# Patient Record
Sex: Female | Born: 1960 | Race: Black or African American | Hispanic: No | Marital: Single | Smoking: Never smoker
Health system: Southern US, Community
[De-identification: ages and names within clinical notes are randomized; demographics above are authoritative.]

## PROBLEM LIST (undated history)

## (undated) DIAGNOSIS — B379 Candidiasis, unspecified: Secondary | ICD-10-CM

## (undated) DIAGNOSIS — I10 Essential (primary) hypertension: Secondary | ICD-10-CM

## (undated) DIAGNOSIS — Z8742 Personal history of other diseases of the female genital tract: Secondary | ICD-10-CM

## (undated) DIAGNOSIS — I8390 Asymptomatic varicose veins of unspecified lower extremity: Secondary | ICD-10-CM

## (undated) DIAGNOSIS — F439 Reaction to severe stress, unspecified: Secondary | ICD-10-CM

## (undated) DIAGNOSIS — R51 Headache: Secondary | ICD-10-CM

## (undated) DIAGNOSIS — Z8619 Personal history of other infectious and parasitic diseases: Secondary | ICD-10-CM

## (undated) HISTORY — DX: Essential (primary) hypertension: I10

## (undated) HISTORY — DX: Reaction to severe stress, unspecified: F43.9

## (undated) HISTORY — DX: Personal history of other diseases of the female genital tract: Z87.42

## (undated) HISTORY — DX: Candidiasis, unspecified: B37.9

## (undated) HISTORY — DX: Headache: R51

## (undated) HISTORY — DX: Asymptomatic varicose veins of unspecified lower extremity: I83.90

## (undated) HISTORY — DX: Personal history of other infectious and parasitic diseases: Z86.19

---

## 1997-12-17 ENCOUNTER — Other Ambulatory Visit: Admission: RE | Admit: 1997-12-17 | Discharge: 1997-12-17 | Payer: Self-pay | Admitting: Obstetrics and Gynecology

## 1999-05-19 ENCOUNTER — Other Ambulatory Visit: Admission: RE | Admit: 1999-05-19 | Discharge: 1999-05-19 | Payer: Self-pay | Admitting: Obstetrics and Gynecology

## 2000-08-30 DIAGNOSIS — B379 Candidiasis, unspecified: Secondary | ICD-10-CM

## 2000-08-30 HISTORY — DX: Candidiasis, unspecified: B37.9

## 2000-11-03 ENCOUNTER — Other Ambulatory Visit: Admission: RE | Admit: 2000-11-03 | Discharge: 2000-11-03 | Payer: Self-pay | Admitting: Obstetrics and Gynecology

## 2000-11-15 ENCOUNTER — Ambulatory Visit (HOSPITAL_COMMUNITY): Admission: RE | Admit: 2000-11-15 | Discharge: 2000-11-15 | Payer: Self-pay | Admitting: Obstetrics and Gynecology

## 2000-11-15 ENCOUNTER — Encounter: Payer: Self-pay | Admitting: Obstetrics and Gynecology

## 2002-01-09 ENCOUNTER — Other Ambulatory Visit: Admission: RE | Admit: 2002-01-09 | Discharge: 2002-01-09 | Payer: Self-pay | Admitting: Obstetrics and Gynecology

## 2003-03-08 ENCOUNTER — Ambulatory Visit (HOSPITAL_COMMUNITY): Admission: RE | Admit: 2003-03-08 | Discharge: 2003-03-08 | Payer: Self-pay | Admitting: Obstetrics and Gynecology

## 2003-03-08 ENCOUNTER — Encounter: Payer: Self-pay | Admitting: Obstetrics and Gynecology

## 2003-06-03 ENCOUNTER — Other Ambulatory Visit: Admission: RE | Admit: 2003-06-03 | Discharge: 2003-06-03 | Payer: Self-pay | Admitting: Obstetrics and Gynecology

## 2004-08-30 DIAGNOSIS — F439 Reaction to severe stress, unspecified: Secondary | ICD-10-CM

## 2004-08-30 DIAGNOSIS — I8390 Asymptomatic varicose veins of unspecified lower extremity: Secondary | ICD-10-CM

## 2004-08-30 DIAGNOSIS — Z8742 Personal history of other diseases of the female genital tract: Secondary | ICD-10-CM

## 2004-08-30 HISTORY — DX: Personal history of other diseases of the female genital tract: Z87.42

## 2004-08-30 HISTORY — DX: Reaction to severe stress, unspecified: F43.9

## 2004-08-30 HISTORY — DX: Asymptomatic varicose veins of unspecified lower extremity: I83.90

## 2004-09-23 ENCOUNTER — Ambulatory Visit (HOSPITAL_COMMUNITY): Admission: RE | Admit: 2004-09-23 | Discharge: 2004-09-23 | Payer: Self-pay | Admitting: Obstetrics and Gynecology

## 2004-11-18 ENCOUNTER — Other Ambulatory Visit: Admission: RE | Admit: 2004-11-18 | Discharge: 2004-11-18 | Payer: Self-pay | Admitting: Obstetrics and Gynecology

## 2005-12-02 ENCOUNTER — Ambulatory Visit (HOSPITAL_COMMUNITY): Admission: RE | Admit: 2005-12-02 | Discharge: 2005-12-02 | Payer: Self-pay | Admitting: Obstetrics and Gynecology

## 2006-02-14 ENCOUNTER — Other Ambulatory Visit: Admission: RE | Admit: 2006-02-14 | Discharge: 2006-02-14 | Payer: Self-pay | Admitting: Obstetrics and Gynecology

## 2007-02-17 ENCOUNTER — Ambulatory Visit (HOSPITAL_COMMUNITY): Admission: RE | Admit: 2007-02-17 | Discharge: 2007-02-17 | Payer: Self-pay | Admitting: Obstetrics and Gynecology

## 2008-03-06 ENCOUNTER — Ambulatory Visit (HOSPITAL_COMMUNITY): Admission: RE | Admit: 2008-03-06 | Discharge: 2008-03-06 | Payer: Self-pay | Admitting: Obstetrics and Gynecology

## 2008-03-13 ENCOUNTER — Encounter: Admission: RE | Admit: 2008-03-13 | Discharge: 2008-03-13 | Payer: Self-pay | Admitting: Obstetrics and Gynecology

## 2009-03-14 ENCOUNTER — Encounter: Admission: RE | Admit: 2009-03-14 | Discharge: 2009-03-14 | Payer: Self-pay | Admitting: Obstetrics and Gynecology

## 2010-03-16 ENCOUNTER — Encounter: Admission: RE | Admit: 2010-03-16 | Discharge: 2010-03-16 | Payer: Self-pay | Admitting: Obstetrics and Gynecology

## 2011-03-23 ENCOUNTER — Other Ambulatory Visit: Payer: Self-pay | Admitting: Obstetrics and Gynecology

## 2011-03-23 DIAGNOSIS — Z1231 Encounter for screening mammogram for malignant neoplasm of breast: Secondary | ICD-10-CM

## 2011-03-29 ENCOUNTER — Ambulatory Visit
Admission: RE | Admit: 2011-03-29 | Discharge: 2011-03-29 | Disposition: A | Discharge status: Home / Self Care | Payer: BC Managed Care – PPO | Source: Ambulatory Visit | Attending: Obstetrics and Gynecology | Admitting: Obstetrics and Gynecology

## 2011-03-29 DIAGNOSIS — Z1231 Encounter for screening mammogram for malignant neoplasm of breast: Secondary | ICD-10-CM

## 2012-04-04 ENCOUNTER — Other Ambulatory Visit: Payer: Self-pay | Admitting: Obstetrics and Gynecology

## 2012-04-04 DIAGNOSIS — Z1231 Encounter for screening mammogram for malignant neoplasm of breast: Secondary | ICD-10-CM

## 2012-04-10 ENCOUNTER — Ambulatory Visit
Admission: RE | Admit: 2012-04-10 | Discharge: 2012-04-10 | Disposition: A | Discharge status: Home / Self Care | Payer: BC Managed Care – PPO | Source: Ambulatory Visit | Attending: Obstetrics and Gynecology | Admitting: Obstetrics and Gynecology

## 2012-04-10 DIAGNOSIS — Z1231 Encounter for screening mammogram for malignant neoplasm of breast: Secondary | ICD-10-CM

## 2012-04-13 ENCOUNTER — Encounter: Payer: Self-pay | Admitting: Obstetrics and Gynecology

## 2012-04-13 ENCOUNTER — Other Ambulatory Visit: Payer: Self-pay | Admitting: Obstetrics and Gynecology

## 2012-04-13 ENCOUNTER — Ambulatory Visit (INDEPENDENT_AMBULATORY_CARE_PROVIDER_SITE_OTHER): Payer: BC Managed Care – PPO | Admitting: Obstetrics and Gynecology

## 2012-04-13 VITALS — BP 112/62 | Ht 66.0 in | Wt 208.0 lb

## 2012-04-13 DIAGNOSIS — R928 Other abnormal and inconclusive findings on diagnostic imaging of breast: Secondary | ICD-10-CM

## 2012-04-13 DIAGNOSIS — I10 Essential (primary) hypertension: Secondary | ICD-10-CM

## 2012-04-13 DIAGNOSIS — D219 Benign neoplasm of connective and other soft tissue, unspecified: Secondary | ICD-10-CM

## 2012-04-13 DIAGNOSIS — R921 Mammographic calcification found on diagnostic imaging of breast: Secondary | ICD-10-CM

## 2012-04-13 DIAGNOSIS — D259 Leiomyoma of uterus, unspecified: Secondary | ICD-10-CM

## 2012-04-13 DIAGNOSIS — Z124 Encounter for screening for malignant neoplasm of cervix: Secondary | ICD-10-CM

## 2012-04-13 DIAGNOSIS — Z8619 Personal history of other infectious and parasitic diseases: Secondary | ICD-10-CM | POA: Insufficient documentation

## 2012-04-13 DIAGNOSIS — F439 Reaction to severe stress, unspecified: Secondary | ICD-10-CM | POA: Insufficient documentation

## 2012-04-13 DIAGNOSIS — I8390 Asymptomatic varicose veins of unspecified lower extremity: Secondary | ICD-10-CM | POA: Insufficient documentation

## 2012-04-13 DIAGNOSIS — R519 Headache, unspecified: Secondary | ICD-10-CM | POA: Insufficient documentation

## 2012-04-13 DIAGNOSIS — F430 Acute stress reaction: Secondary | ICD-10-CM

## 2012-04-13 DIAGNOSIS — Z8742 Personal history of other diseases of the female genital tract: Secondary | ICD-10-CM | POA: Insufficient documentation

## 2012-04-13 DIAGNOSIS — B379 Candidiasis, unspecified: Secondary | ICD-10-CM

## 2012-04-13 DIAGNOSIS — R51 Headache: Secondary | ICD-10-CM

## 2012-04-13 NOTE — Progress Notes (Signed)
AEX  Last Pap: 03/22/2011 WNL: Yes Regular Periods:yes Contraception: None  Monthly Breast exam:no Tetanus<62yrs:yes Nl.Bladder Function:yes Daily BMs:yes Healthy Diet:yes Calcium:no Mammogram:yes Date of Mammogram: 04/12/2012 Exercise:yes Have often Exercise: 2 times weekly Seatbelt: yes Abuse at home: no Stressful work:yes Sigmoid-colonoscopy: none Bone Density: No PCP: Dr. Providence Lanius, [facility] Change in PMH: none Change in [facility]: none Subjective:    Grace Cole is a 51 y.o. female, G0P0000, who presents for an annual exam.     History   Social History  . Marital Status: Single    Spouse Name: N/A    Number of Children: N/A  . Years of Education: N/A   Social History Main Topics  . Smoking status: Never Smoker   . Smokeless tobacco: Never Used  . Alcohol Use: No  . Drug Use: No  . Sexually Active: Yes    Birth Control/ Protection: None   Other Topics Concern  . None   Social History Narrative  . None    Menstrual cycle:   LMP: Patient's last menstrual period was 03/19/2012.           Cycle: REG monthly.  Heavy for 1st 2 days total of 6 days.  No IM bleeding.  Mild cramps, no meds  The following portions of the patient's history were reviewed and updated as appropriate: allergies, current medications, past family history, past medical history, past social history, past surgical history and problem list.  Review of Systems Pertinent items are noted in HPI. Breast:Negative for breast lump,nipple discharge or nipple retraction Gastrointestinal: Negative for abdominal pain, change in bowel habits or rectal bleeding Urinary:negative   Objective:    BP 112/62  Ht 5\' 6"  (1.676 m)  Wt 208 lb (94.348 kg)  BMI 33.57 kg/m2  LMP 03/19/2012    Weight:  Wt Readings from Last 1 Encounters:  04/13/12 208 lb (94.348 kg)          BMI: Body mass index is 33.57 kg/(m^2).  General Appearance: Alert, appropriate appearance for age. No acute distress HEENT:  Grossly normal Neck / Thyroid: Supple, no masses, nodes or enlargement Lungs: clear to auscultation bilaterally Back: No CVA tenderness Breast Exam: No masses or nodes.No dimpling, nipple retraction or discharge. Cardiovascular: Regular rate and rhythm. S1, S2, no murmur Gastrointestinal: Soft, non-tender, no masses or organomegaly Pelvic Exam: External genitalia: normal general appearance Vaginal: normal mucosa without prolapse or lesions Cervix: normal appearance Adnexa: no masses noted Uterus: irregular enlargement and 10-12 weeks size Rectovaginal: normal rectal, no masses Lymphatic Exam: Non-palpable nodes in neck, clavicular, axillary, or inguinal regions Skin: no rash or abnormalities Neurologic: Normal gait and speech, no tremor  Psychiatric: Alert and oriented, appropriate affect.   Wet Prep:not applicable Urinalysis:not applicable UPT: Not done   Assessment:    Fibroids  Menorrhagia, though pt declines rx   Plan:    mammogram pap smear with HR HPV.  IF nl, next due 2016 return annually or prn STD screening: declined Contraception:no method, declines       Corinthian Mizrahi PMD

## 2012-04-14 ENCOUNTER — Ambulatory Visit
Admission: RE | Admit: 2012-04-14 | Discharge: 2012-04-14 | Disposition: A | Discharge status: Home / Self Care | Payer: BC Managed Care – PPO | Source: Ambulatory Visit | Attending: Obstetrics and Gynecology | Admitting: Obstetrics and Gynecology

## 2012-04-14 DIAGNOSIS — R928 Other abnormal and inconclusive findings on diagnostic imaging of breast: Secondary | ICD-10-CM

## 2012-04-17 ENCOUNTER — Encounter: Payer: Self-pay | Admitting: Obstetrics and Gynecology

## 2012-04-17 DIAGNOSIS — R921 Mammographic calcification found on diagnostic imaging of breast: Secondary | ICD-10-CM | POA: Insufficient documentation

## 2012-04-17 LAB — PAP IG AND HPV HIGH-RISK

## 2013-03-20 ENCOUNTER — Other Ambulatory Visit: Payer: Self-pay

## 2013-03-20 DIAGNOSIS — Z1231 Encounter for screening mammogram for malignant neoplasm of breast: Secondary | ICD-10-CM

## 2013-04-16 ENCOUNTER — Ambulatory Visit
Admission: RE | Admit: 2013-04-16 | Discharge: 2013-04-16 | Disposition: A | Discharge status: Home / Self Care | Payer: BC Managed Care – PPO | Source: Ambulatory Visit

## 2013-04-16 DIAGNOSIS — Z1231 Encounter for screening mammogram for malignant neoplasm of breast: Secondary | ICD-10-CM

## 2014-03-25 ENCOUNTER — Other Ambulatory Visit: Payer: Self-pay

## 2014-03-25 DIAGNOSIS — Z1231 Encounter for screening mammogram for malignant neoplasm of breast: Secondary | ICD-10-CM

## 2014-04-17 ENCOUNTER — Ambulatory Visit
Admission: RE | Admit: 2014-04-17 | Discharge: 2014-04-17 | Disposition: A | Discharge status: Home / Self Care | Payer: BC Managed Care – PPO | Source: Ambulatory Visit

## 2014-04-17 ENCOUNTER — Encounter (INDEPENDENT_AMBULATORY_CARE_PROVIDER_SITE_OTHER): Payer: Self-pay

## 2014-04-17 DIAGNOSIS — Z1231 Encounter for screening mammogram for malignant neoplasm of breast: Secondary | ICD-10-CM

## 2015-04-03 ENCOUNTER — Other Ambulatory Visit: Payer: Self-pay

## 2015-04-03 DIAGNOSIS — Z1231 Encounter for screening mammogram for malignant neoplasm of breast: Secondary | ICD-10-CM

## 2015-04-21 ENCOUNTER — Ambulatory Visit
Admission: RE | Admit: 2015-04-21 | Discharge: 2015-04-21 | Disposition: A | Discharge status: Home / Self Care | Payer: BC Managed Care – PPO | Source: Ambulatory Visit

## 2015-04-21 DIAGNOSIS — Z1231 Encounter for screening mammogram for malignant neoplasm of breast: Secondary | ICD-10-CM

## 2016-03-15 ENCOUNTER — Other Ambulatory Visit: Payer: Self-pay | Admitting: Obstetrics and Gynecology

## 2016-03-15 DIAGNOSIS — Z1231 Encounter for screening mammogram for malignant neoplasm of breast: Secondary | ICD-10-CM

## 2016-04-21 ENCOUNTER — Ambulatory Visit: Payer: BC Managed Care – PPO

## 2016-04-27 ENCOUNTER — Ambulatory Visit
Admission: RE | Admit: 2016-04-27 | Discharge: 2016-04-27 | Disposition: A | Discharge status: Home / Self Care | Payer: BC Managed Care – PPO | Source: Ambulatory Visit | Attending: Obstetrics and Gynecology | Admitting: Obstetrics and Gynecology

## 2016-04-27 DIAGNOSIS — Z1231 Encounter for screening mammogram for malignant neoplasm of breast: Secondary | ICD-10-CM

## 2017-03-18 ENCOUNTER — Other Ambulatory Visit: Payer: Self-pay | Admitting: Obstetrics and Gynecology

## 2017-03-18 DIAGNOSIS — Z1231 Encounter for screening mammogram for malignant neoplasm of breast: Secondary | ICD-10-CM

## 2017-05-18 ENCOUNTER — Ambulatory Visit
Admission: RE | Admit: 2017-05-18 | Discharge: 2017-05-18 | Disposition: A | Discharge status: Home / Self Care | Payer: BC Managed Care – PPO | Source: Ambulatory Visit | Attending: Obstetrics and Gynecology | Admitting: Obstetrics and Gynecology

## 2017-05-18 DIAGNOSIS — Z1231 Encounter for screening mammogram for malignant neoplasm of breast: Secondary | ICD-10-CM

## 2018-06-26 ENCOUNTER — Ambulatory Visit
Admission: RE | Admit: 2018-06-26 | Discharge: 2018-06-26 | Disposition: A | Discharge status: Home / Self Care | Payer: BC Managed Care – PPO | Source: Ambulatory Visit | Attending: Obstetrics and Gynecology | Admitting: Obstetrics and Gynecology

## 2018-06-26 ENCOUNTER — Other Ambulatory Visit: Payer: Self-pay | Admitting: Obstetrics and Gynecology

## 2018-06-26 DIAGNOSIS — Z1231 Encounter for screening mammogram for malignant neoplasm of breast: Secondary | ICD-10-CM

## 2019-06-21 ENCOUNTER — Other Ambulatory Visit: Payer: Self-pay | Admitting: Family Medicine

## 2019-06-21 ENCOUNTER — Other Ambulatory Visit: Payer: Self-pay | Admitting: Nurse Practitioner

## 2019-06-21 DIAGNOSIS — Z1231 Encounter for screening mammogram for malignant neoplasm of breast: Secondary | ICD-10-CM

## 2019-08-13 ENCOUNTER — Other Ambulatory Visit: Payer: Self-pay

## 2019-08-13 ENCOUNTER — Ambulatory Visit
Admission: RE | Admit: 2019-08-13 | Discharge: 2019-08-13 | Disposition: A | Discharge status: Home / Self Care | Payer: BC Managed Care – PPO | Source: Ambulatory Visit | Attending: Nurse Practitioner | Admitting: Nurse Practitioner

## 2019-08-13 DIAGNOSIS — Z1231 Encounter for screening mammogram for malignant neoplasm of breast: Secondary | ICD-10-CM

## 2019-10-27 ENCOUNTER — Ambulatory Visit: Payer: BC Managed Care – PPO | Attending: Internal Medicine

## 2019-10-27 DIAGNOSIS — Z23 Encounter for immunization: Secondary | ICD-10-CM

## 2019-10-27 NOTE — Progress Notes (Signed)
   Covid-19 Vaccination Clinic  Name:  Grace Cole    MRN: [id number] DOB: 1960-09-18  10/27/2019  [name] was observed post Covid-19 immunization for 15 minutes without incidence. She was provided with Vaccine Information Sheet and instruction to access the V-Safe system.   [name] was instructed to call 911 with any severe reactions post vaccine: Marland Kitchen Difficulty breathing  . Swelling of your face and throat  . A fast heartbeat  . A bad rash all over your body  . Dizziness and weakness    Immunizations Administered    Name Date Dose VIS Date Route   Pfizer COVID-19 Vaccine 10/27/2019 12:44 PM 0.3 mL 08/10/2019 Intramuscular   Manufacturer: [place]   Lot: [id number]   [place]: [id number]

## 2019-11-17 ENCOUNTER — Ambulatory Visit: Payer: BC Managed Care – PPO | Attending: Internal Medicine

## 2019-11-17 DIAGNOSIS — Z23 Encounter for immunization: Secondary | ICD-10-CM

## 2019-11-17 NOTE — Progress Notes (Signed)
   Covid-19 Vaccination Clinic  Name:  Grace Cole    MRN: [id number] DOB: Jul 23, 1961  11/17/2019  Grace Cole was observed post Covid-19 immunization for 15 minutes without incident. She was provided with Vaccine Information Sheet and instruction to access the V-Safe system.   Grace Cole was instructed to call 911 with any severe reactions post vaccine: Marland Kitchen Difficulty breathing  . Swelling of face and throat  . A fast heartbeat  . A bad rash all over body  . Dizziness and weakness   Immunizations Administered    Name Date Dose VIS Date Route   Pfizer COVID-19 Vaccine 11/17/2019 12:28 PM 0.3 mL 08/10/2019 Intramuscular   Manufacturer: [place]   Lot: [id number]   [place]: [id number]

## 2019-11-27 ENCOUNTER — Ambulatory Visit: Payer: BC Managed Care – PPO

## 2020-07-10 ENCOUNTER — Other Ambulatory Visit: Payer: Self-pay | Admitting: Family Medicine

## 2020-07-10 DIAGNOSIS — Z1231 Encounter for screening mammogram for malignant neoplasm of breast: Secondary | ICD-10-CM

## 2020-11-21 ENCOUNTER — Other Ambulatory Visit: Payer: Self-pay

## 2020-11-21 ENCOUNTER — Ambulatory Visit
Admission: RE | Admit: 2020-11-21 | Discharge: 2020-11-21 | Disposition: A | Discharge status: Home / Self Care | Payer: BC Managed Care – PPO | Source: Ambulatory Visit | Attending: Family Medicine | Admitting: Family Medicine

## 2020-11-21 DIAGNOSIS — Z1231 Encounter for screening mammogram for malignant neoplasm of breast: Secondary | ICD-10-CM

## 2021-03-20 ENCOUNTER — Other Ambulatory Visit (HOSPITAL_COMMUNITY): Payer: Self-pay | Admitting: Gastroenterology

## 2021-03-20 DIAGNOSIS — R1310 Dysphagia, unspecified: Secondary | ICD-10-CM

## 2021-04-17 ENCOUNTER — Ambulatory Visit (HOSPITAL_COMMUNITY)
Admission: RE | Admit: 2021-04-17 | Discharge: 2021-04-17 | Disposition: A | Discharge status: Home / Self Care | Payer: BC Managed Care – PPO | Source: Ambulatory Visit | Attending: Gastroenterology | Admitting: Gastroenterology

## 2021-04-17 ENCOUNTER — Other Ambulatory Visit: Payer: Self-pay

## 2021-04-17 ENCOUNTER — Other Ambulatory Visit (HOSPITAL_COMMUNITY): Payer: Self-pay | Admitting: Gastroenterology

## 2021-04-17 DIAGNOSIS — R1310 Dysphagia, unspecified: Secondary | ICD-10-CM

## 2021-11-26 ENCOUNTER — Other Ambulatory Visit: Payer: Self-pay | Admitting: Family Medicine

## 2021-11-26 DIAGNOSIS — Z1231 Encounter for screening mammogram for malignant neoplasm of breast: Secondary | ICD-10-CM

## 2021-12-03 ENCOUNTER — Ambulatory Visit
Admission: RE | Admit: 2021-12-03 | Discharge: 2021-12-03 | Disposition: A | Discharge status: Home / Self Care | Payer: BC Managed Care – PPO | Source: Ambulatory Visit | Attending: Family Medicine | Admitting: Family Medicine

## 2021-12-03 DIAGNOSIS — Z1231 Encounter for screening mammogram for malignant neoplasm of breast: Secondary | ICD-10-CM

## 2022-11-03 ENCOUNTER — Other Ambulatory Visit: Payer: Self-pay | Admitting: Family Medicine

## 2022-11-03 DIAGNOSIS — Z1231 Encounter for screening mammogram for malignant neoplasm of breast: Secondary | ICD-10-CM

## 2022-12-17 ENCOUNTER — Ambulatory Visit
Admission: RE | Admit: 2022-12-17 | Discharge: 2022-12-17 | Disposition: A | Discharge status: Home / Self Care | Payer: BC Managed Care – PPO | Source: Ambulatory Visit | Attending: Family Medicine | Admitting: Family Medicine

## 2022-12-17 DIAGNOSIS — Z1231 Encounter for screening mammogram for malignant neoplasm of breast: Secondary | ICD-10-CM

## 2022-12-22 ENCOUNTER — Other Ambulatory Visit: Payer: Self-pay | Admitting: Family Medicine

## 2022-12-22 DIAGNOSIS — R928 Other abnormal and inconclusive findings on diagnostic imaging of breast: Secondary | ICD-10-CM

## 2023-01-04 ENCOUNTER — Ambulatory Visit
Admission: RE | Admit: 2023-01-04 | Discharge: 2023-01-04 | Disposition: A | Discharge status: Home / Self Care | Payer: BC Managed Care – PPO | Source: Ambulatory Visit | Attending: Family Medicine | Admitting: Family Medicine

## 2023-01-04 ENCOUNTER — Ambulatory Visit: Admission: RE | Admit: 2023-01-04 | Payer: BC Managed Care – PPO | Source: Ambulatory Visit

## 2023-01-04 DIAGNOSIS — R928 Other abnormal and inconclusive findings on diagnostic imaging of breast: Secondary | ICD-10-CM

## 2023-01-16 IMAGING — MG MM DIGITAL SCREENING BILAT W/ TOMO AND CAD
8 series · 8 of 24 positions shown · non-contrast
Comparison: Previous exam(s).

CLINICAL DATA: Screening.

EXAM:
DIGITAL SCREENING BILATERAL MAMMOGRAM WITH TOMOSYNTHESIS AND CAD
TECHNIQUE: Bilateral screening digital craniocaudal and mediolateral oblique
mammograms were obtained. Bilateral screening digital breast
tomosynthesis was performed. The images were evaluated with
computer-aided detection.

[L CC synth-2D]
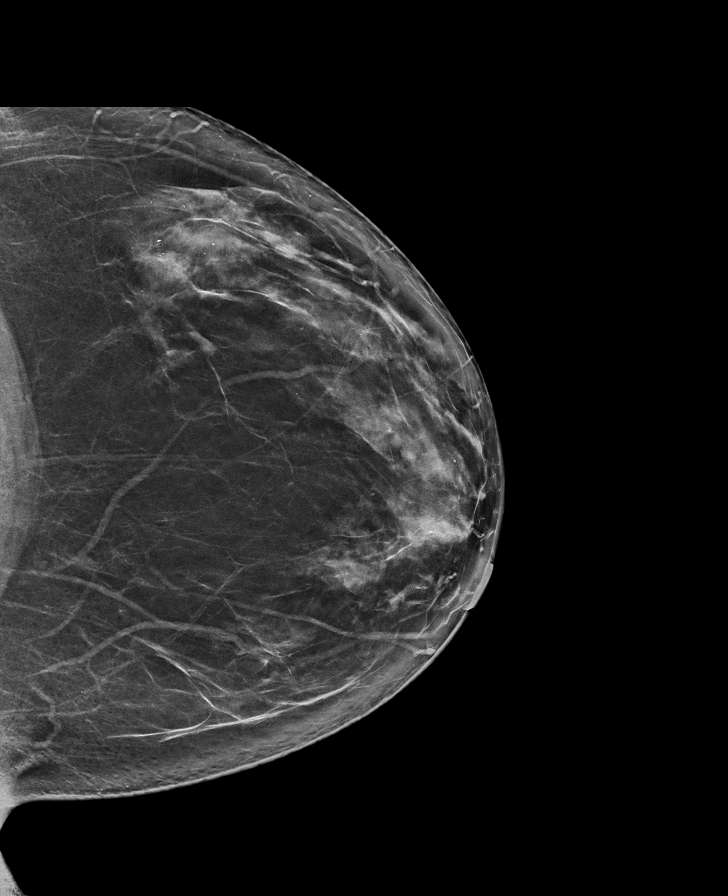

[R CC synth-2D]
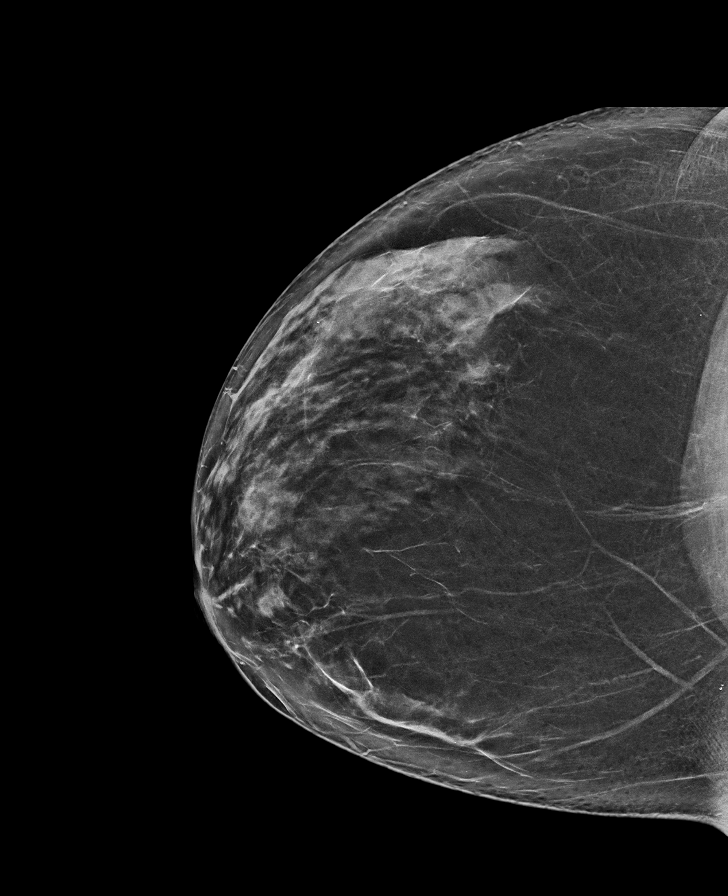

[L MLO synth-2D]
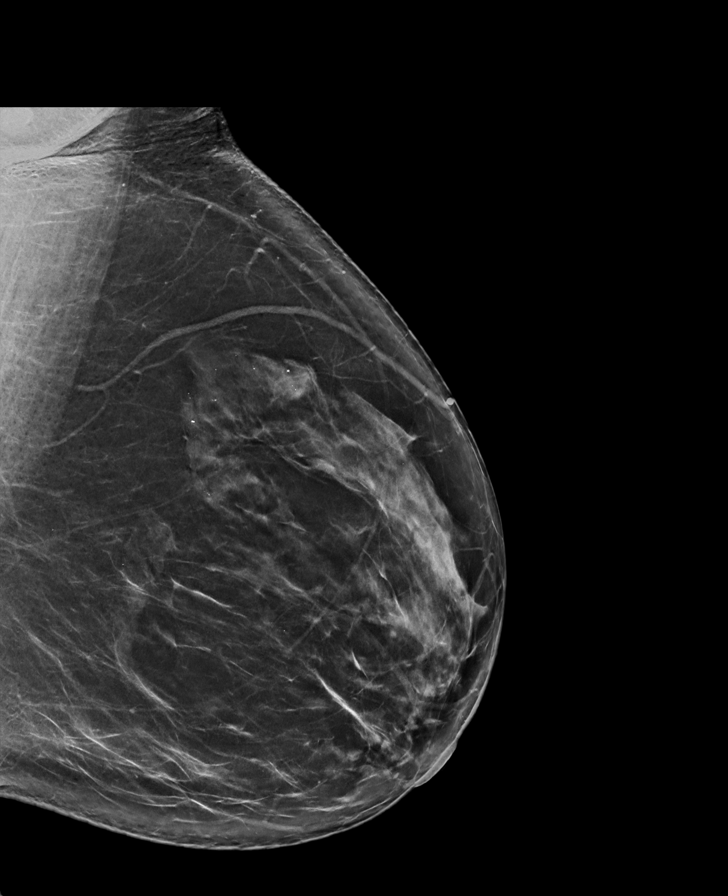

[R MLO synth-2D]
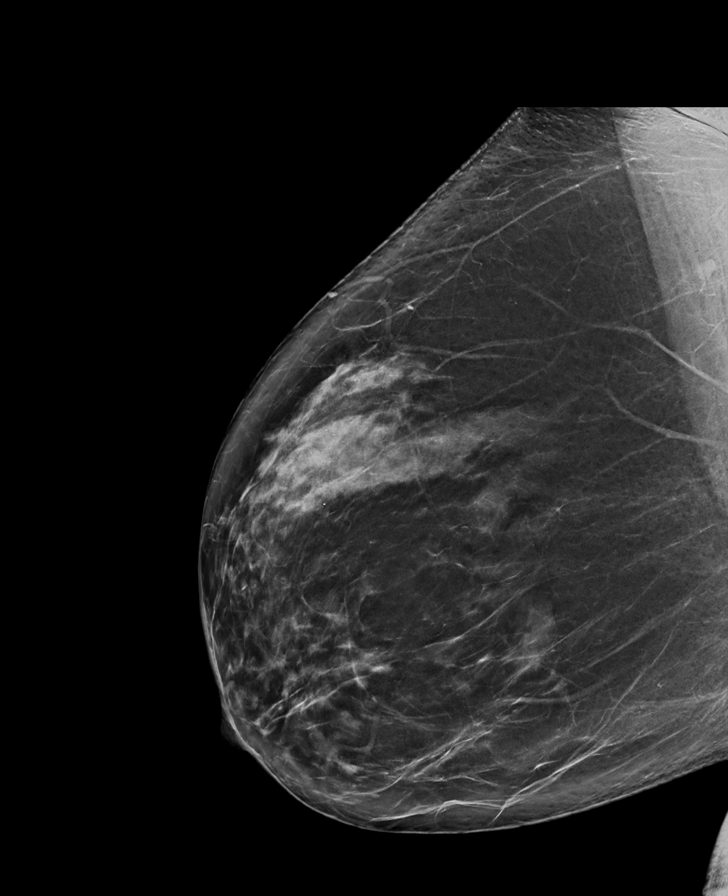

[R MLO tomo · tomo slice 43/85.0]
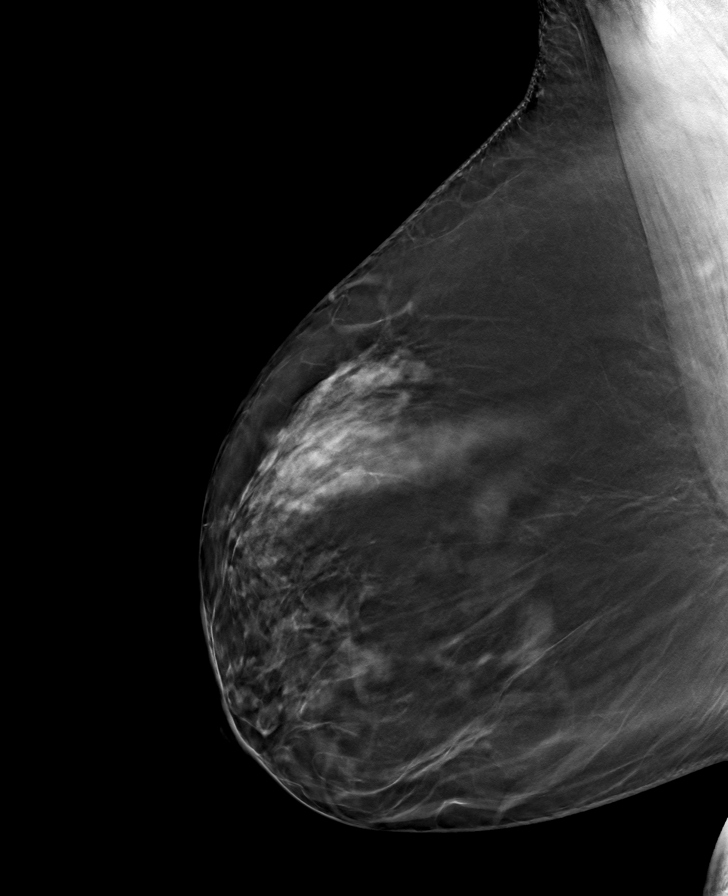

[L CC tomo · tomo slice 43/84.0]
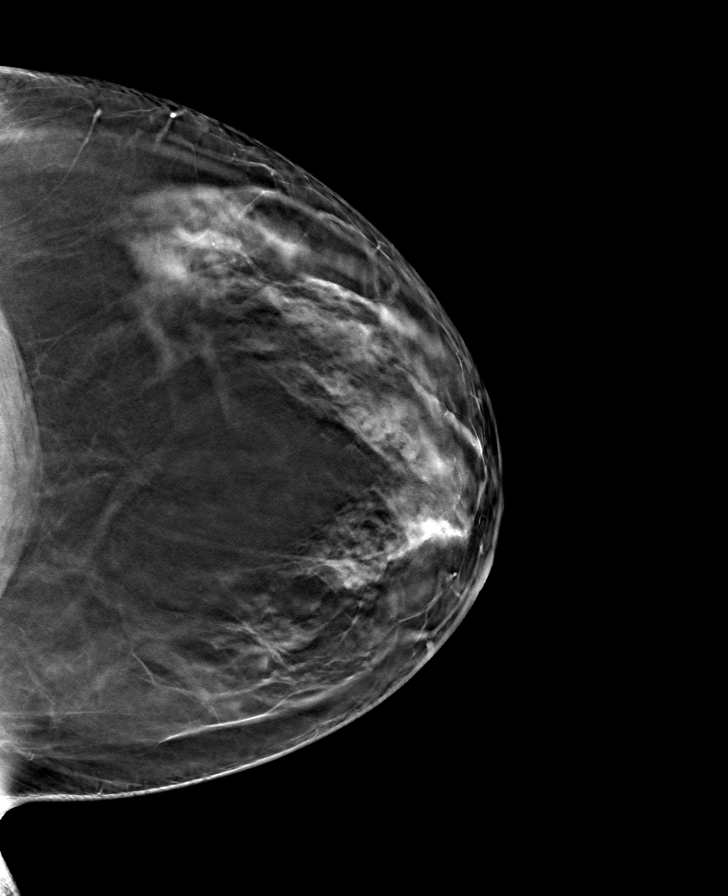

[R CC tomo · tomo slice 43/84.0]
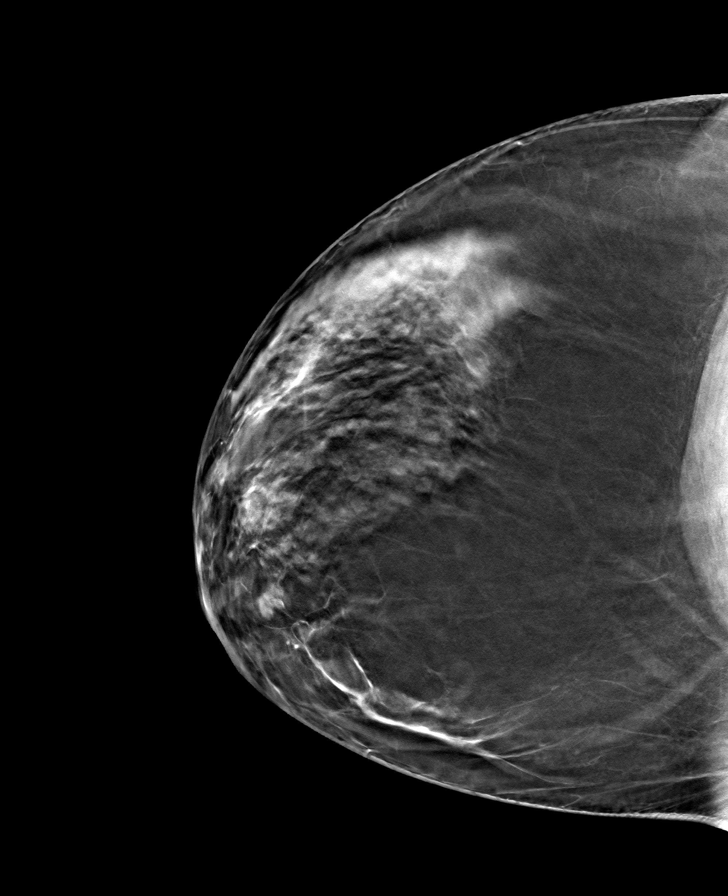

[L MLO tomo · tomo slice 43/85.0]
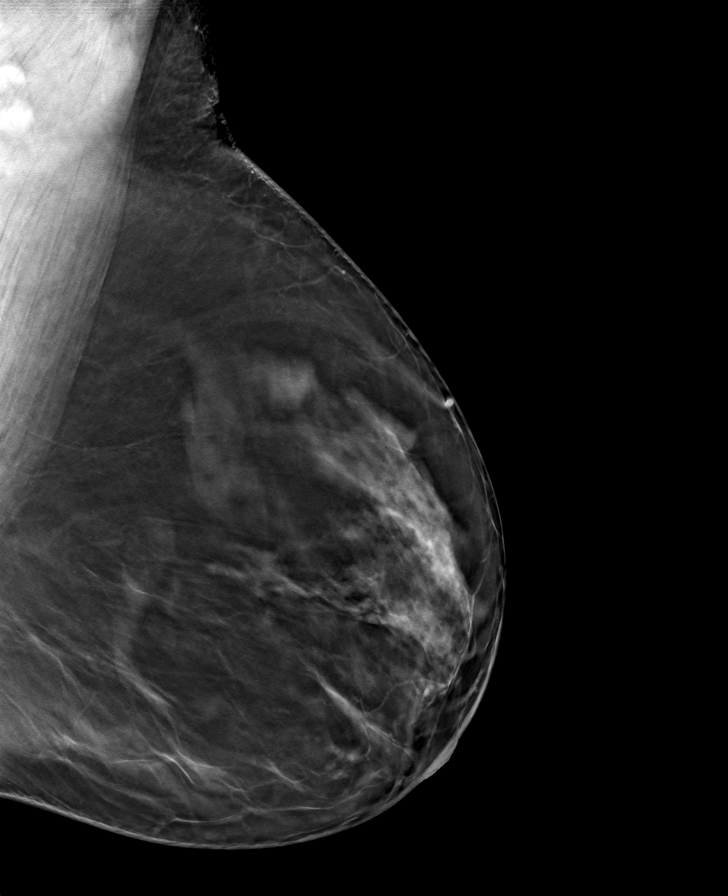

[8 of 24 positions shown; findings below may reference images not displayed]

ACR Breast Density Category c: The breast tissue is heterogeneously
dense, which may obscure small masses.
FINDINGS: There are no findings suspicious for malignancy.
IMPRESSION: No mammographic evidence of malignancy. A result letter of this
screening mammogram will be mailed directly to the patient.

RECOMMENDATION:
Screening mammogram in one year. (Code:Q3-W-BC3)

BI-RADS CATEGORY  1: Negative.

## 2023-10-05 ENCOUNTER — Ambulatory Visit
Admission: EM | Admit: 2023-10-05 | Discharge: 2023-10-05 | Disposition: A | Discharge status: Home / Self Care | Payer: 59

## 2023-10-05 ENCOUNTER — Encounter: Payer: Self-pay | Admitting: Emergency Medicine

## 2023-10-05 DIAGNOSIS — R220 Localized swelling, mass and lump, head: Secondary | ICD-10-CM

## 2023-10-05 DIAGNOSIS — T781XXA Other adverse food reactions, not elsewhere classified, initial encounter: Secondary | ICD-10-CM

## 2023-10-05 MED ORDER — FAMOTIDINE 20 MG PO TABS: 20.0000 mg | ORAL_TABLET | Freq: Two times a day (BID) | ORAL | 0 refills | Status: DC

## 2023-10-05 MED ORDER — PREDNISONE 20 MG PO TABS: 40.0000 mg | ORAL_TABLET | Freq: Every day | ORAL | 0 refills | Status: AC

## 2023-10-05 NOTE — ED Triage Notes (Addendum)
 Pt presents due to tongue swelling. States this is the second time this occurred. Yesterday she had spicy shrimp and afterward her mouth felt dry and tongue swollen.  The first time she experienced this was about two weeks ago after eating jalapeno chips.   Denies any known allergies. States symptoms are worse at night.

## 2023-10-05 NOTE — ED Provider Notes (Signed)
 [facility]    CSN: [id number] Arrival date & time: 10/05/23  1413      History   Chief Complaint Chief Complaint  Patient presents with   Oral Swelling    HPI Grace Cole is a 63 y.o. female.   Grace Cole is a 63 y.o. female presenting for chief complaint of Oral Swelling. She feels as though her tongue is swollen after eating spicy shrimp and catfish last night.  She ate catfish approximately 1 hour prior to going to bed and noticed generalized tongue swelling sensation when she was going to sleep.  Reports throat closure sensation last night and says she looked in the mirror to see if she could see her tonsils and noticed her throat was swollen.  She was able to sleep last night without difficulty and woke up this morning with further coming swelling.  She reports her voice sounds normal and she has never had previous allergy  to shrimp or Fish.  She purchased prebreaded shrimp/catfish from the grocery store and states she normally breads/seasons her for herself at home before cooking. Denies oral itching, rash, fever, chills, nausea, vomiting, diarrhea, abdominal pain.   She additionally denies GERD symptoms. No recent other changes in medications.  No recent steroid or antibiotic use.  Zyrtec and Benadryl make her very sleepy so she has not attempted use of either of these medications at home.  No history of anaphylaxis.     Past Medical History:  Diagnosis Date   H/O dysmenorrhea 2006   H/O: menorrhagia 2006   Headache(784.0)    History of herpes simplex type 2 infection    Hypertension    Monilia infection 2002   Stress 2006   Varicose veins 2006    Patient Active Problem List   Diagnosis Date Noted   Calcification of left breast 04/17/2012   History of herpes simplex type 2 infection    Monilia infection    H/O: menorrhagia    H/O dysmenorrhea    Headache    Stress    Varicose veins    Hypertension     History reviewed. No  pertinent surgical history.  OB History     Gravida  0   Para  0   Term  0   Preterm  0   AB  0   Living  0      SAB  0   IAB  0   Ectopic  0   Multiple  0   Live Births               Home Medications    Prior to Admission medications   Medication Sig Start Date End Date Taking? Authorizing Provider  famotidine  (PEPCID ) 20 MG tablet Take 1 tablet (20 mg total) by mouth 2 (two) times daily for 7 days. 10/05/23 10/12/23 Yes StanhopeDorna HERO, FNP  predniSONE  (DELTASONE ) 20 MG tablet Take 2 tablets (40 mg total) by mouth daily with breakfast for 5 days. 10/05/23 10/10/23 Yes Enedelia Dorna HERO, FNP  amLODipine (NORVASC) 5 MG tablet Take 5 mg by mouth daily.    [provider]  hydrochlorothiazide (HYDRODIURIL) 25 MG tablet Take 25 mg by mouth daily.    [provider]  valACYclovir (VALTREX) 1000 MG tablet Take 1,000 mg by mouth 2 (two) times daily.    [provider]    Family History Family History  Problem Relation Age of Onset   Hypertension Mother    Cancer Father  Cancer Maternal Aunt        breast   Hypertension Maternal Aunt    Diabetes Maternal Aunt    Breast cancer Maternal Aunt    Breast cancer Cousin     Social History Social History   Tobacco Use   Smoking status: Never   Smokeless tobacco: Never  Substance Use Topics   Alcohol use: No   Drug use: No     Allergies   Patient has no known allergies.   Review of Systems Review of Systems Per HPI  Physical Exam Triage Vital Signs ED Triage Vitals  Encounter Vitals Group     BP 10/05/23 1529 (!) 147/87     Systolic BP Percentile --      Diastolic BP Percentile --      Pulse Rate 10/05/23 1529 66     Resp 10/05/23 1529 17     Temp 10/05/23 1529 98 F (36.7 C)     Temp src --      SpO2 10/05/23 1529 95 %     Weight --      Height --      Head Circumference --      Peak Flow --      Pain Score 10/05/23 1534 0     Pain Loc --      Pain  Education --      Exclude from Growth Chart --    No data found.  Updated Vital Signs BP (!) 147/87   Pulse 66   Temp 98 F (36.7 C)   Resp 17   LMP 03/19/2012   SpO2 95%   Visual Acuity Right Eye Distance:   Left Eye Distance:   Bilateral Distance:    Right Eye Near:   Left Eye Near:    Bilateral Near:     Physical Exam Vitals and nursing note reviewed.  Constitutional:      Appearance: She is not ill-appearing or toxic-appearing.  HENT:     Head: Normocephalic and atraumatic.     Right Ear: Hearing, tympanic membrane, ear canal and external ear normal.     Left Ear: Hearing, tympanic membrane, ear canal and external ear normal.     Nose: Nose normal.     Mouth/Throat:     Lips: Pink.     Mouth: Mucous membranes are moist. No injury or oral lesions.     Dentition: Normal dentition.     Tongue: No lesions. Tongue does not deviate from midline.     Pharynx: Oropharynx is clear. Uvula midline. No pharyngeal swelling, oropharyngeal exudate, posterior oropharyngeal erythema, uvula swelling or postnasal drip.     Tonsils: No tonsillar exudate.     Comments: Tongue appears mildly swollen, no perioral rash or perioral swelling.  Speaking in full sentences without difficulty.  Phonation is normal.  No throat swelling or trismus. Eyes:     General: Lids are normal. Vision grossly intact. Gaze aligned appropriately.     Extraocular Movements: Extraocular movements intact.     Conjunctiva/sclera: Conjunctivae normal.  Neck:     Trachea: Trachea and phonation normal.  Cardiovascular:     Rate and Rhythm: Normal rate and regular rhythm.     Heart sounds: Normal heart sounds, S1 normal and S2 normal.  Pulmonary:     Effort: Pulmonary effort is normal. No respiratory distress.     Breath sounds: Normal breath sounds and air entry.  Musculoskeletal:     Cervical back: Neck supple.  Lymphadenopathy:  Cervical: No cervical adenopathy.  Skin:    General: Skin is warm and dry.      Capillary Refill: Capillary refill takes less than 2 seconds.     Findings: No rash.  Neurological:     General: No focal deficit present.     Mental Status: She is alert and oriented to person, place, and time. Mental status is at baseline.     Cranial Nerves: No dysarthria or facial asymmetry.  Psychiatric:        Mood and Affect: Mood normal.        Speech: Speech normal.        Behavior: Behavior normal.        Thought Content: Thought content normal.        Judgment: Judgment normal.      UC Treatments / Results  Labs (all labs ordered are listed, but only abnormal results are displayed) Labs Reviewed - No data to display  EKG   Radiology No results found.  Procedures Procedures (including critical care time)  Medications Ordered in UC Medications - No data to display  Initial Impression / Assessment and Plan / UC Course  I have reviewed the triage vital signs and the nursing notes.  Pertinent labs & imaging results that were available during my care of the patient were reviewed by me and considered in my medical decision making (see chart for details).   1.  Allergic reaction to food, mild tongue swelling Presentation suspicious for allergic reaction to unknown food substance. Pepcid  and prednisone  burst ordered. Recommended Zyrtec, however patient would prefer not to take this given it causes significant drowsiness. HEENT exam is stable and airway is intact. Lungs clear. No systemic signs of allergic reaction. Recommend follow-up with allergy  and asthma specialist as well as PCP.  Counseled patient on potential for adverse effects with medications prescribed/recommended today, strict ER and return-to-clinic precautions discussed, patient verbalized understanding.    Final Clinical Impressions(s) / UC Diagnoses   Final diagnoses:  Allergic reaction to food, initial encounter  Mild tongue swelling     Discharge Instructions      You have been  evaluated today for an allergic reaction. We gave you medicine to help with symptoms.   Take medications sent to pharmacy as directed.   Please schedule an appointment with your primary care provider for follow-up and ongoing management. Return if you experience rashes, difficulty breathing or swallowing, lip/mouth/tongue swelling, vomiting, or for any other concerning symptoms. If symptoms are severe, please go to the ER for further workup.     ED Prescriptions     Medication Sig Dispense Auth. Provider   predniSONE  (DELTASONE ) 20 MG tablet Take 2 tablets (40 mg total) by mouth daily with breakfast for 5 days. 10 tablet Enedelia Dorna HERO, FNP   famotidine  (PEPCID ) 20 MG tablet Take 1 tablet (20 mg total) by mouth 2 (two) times daily for 7 days. 14 tablet Enedelia Dorna HERO, FNP      PDMP not reviewed this encounter.   Enedelia Dorna HERO, [place] 10/05/23 [phone]

## 2023-10-05 NOTE — Discharge Instructions (Addendum)
 You have been evaluated today for an allergic reaction. We gave you medicine to help with symptoms.   Take medications sent to pharmacy as directed.   Please schedule an appointment with your primary care provider for follow-up and ongoing management. Return if you experience rashes, difficulty breathing or swallowing, lip/mouth/tongue swelling, vomiting, or for any other concerning symptoms. If symptoms are severe, please go to the ER for further workup.

## 2023-11-10 ENCOUNTER — Ambulatory Visit: Payer: Self-pay | Admitting: Internal Medicine

## 2023-11-10 ENCOUNTER — Encounter: Payer: Self-pay | Admitting: Internal Medicine

## 2023-11-10 ENCOUNTER — Other Ambulatory Visit: Payer: Self-pay

## 2023-11-10 VITALS — BP 122/84 | HR 76 | Temp 97.7°F | Resp 17 | Ht 65.8 in | Wt 222.0 lb

## 2023-11-10 DIAGNOSIS — T783XXA Angioneurotic edema, initial encounter: Secondary | ICD-10-CM

## 2023-11-10 DIAGNOSIS — J387 Other diseases of larynx: Secondary | ICD-10-CM

## 2023-11-10 DIAGNOSIS — R058 Other specified cough: Secondary | ICD-10-CM

## 2023-11-10 DIAGNOSIS — T783XXD Angioneurotic edema, subsequent encounter: Secondary | ICD-10-CM | POA: Diagnosis not present

## 2023-11-10 DIAGNOSIS — J310 Chronic rhinitis: Secondary | ICD-10-CM | POA: Diagnosis not present

## 2023-11-10 DIAGNOSIS — K219 Gastro-esophageal reflux disease without esophagitis: Secondary | ICD-10-CM | POA: Diagnosis not present

## 2023-11-10 MED ORDER — AZELASTINE HCL 0.1 % NA SOLN: 2.0000 | Freq: Two times a day (BID) | NASAL | 12 refills | Status: DC

## 2023-11-10 MED ORDER — AZELASTINE HCL 0.1 % NA SOLN: 2.0000 | Freq: Two times a day (BID) | NASAL | 12 refills | Status: AC

## 2023-11-10 MED ORDER — FLUTICASONE PROPIONATE 50 MCG/ACT NA SUSP: 1.0000 | Freq: Every day | NASAL | 2 refills | Status: DC

## 2023-11-10 MED ORDER — FLUTICASONE PROPIONATE 50 MCG/ACT NA SUSP: 1.0000 | Freq: Every day | NASAL | 2 refills | Status: AC

## 2023-11-10 NOTE — Progress Notes (Signed)
 NEW PATIENT Date of Service/Encounter:  11/10/23 Referring provider: Stevphen Rochester, MD Primary care provider: Stevphen Rochester, MD  Subjective:  Grace Cole is a 62 y.o. female  presenting today for evaluation of tongue swelling, rhinitis, cough  History obtained from: chart review and patient.   Discussed the use of AI scribe software for clinical note transcription with the patient, who gave verbal consent to proceed.  History of Present Illness   The patient presents with tongue swelling.  She has experienced two episodes of tongue swelling, the first in mid-January and the second in mid-February. The initial episode began a few hours after consuming spicy jalapeno-flavored potato chips, leading to difficulty swallowing and noticeable tongue swelling the next morning. The swelling lasted 12 to 18 hours and subsided after she took ginger drops and ginger tea. The second episode occurred after eating a spicier brand of fried catfish and shrimp, with similar symptoms. She received prednisone and Pepcid at urgent care, resulting in quicker resolution. No further episodes have occurred since then.  In addition to tongue swelling, she experiences a persistent sensation of her throat feeling sticky and as if it is closing, particularly at night. This has been ongoing for the past week, often accompanied by congestion and mucus production, especially in the morning. She has not been taking any medication regularly for these symptoms, although she has used nasal sprays in the past for about two weeks at a time.  She has experienced epistaxis since the winter, which she attributes to the dry air.         Other allergy screening: Asthma: no Rhino conjunctivitis: yes Food allergy: no Medication allergy: no Hymenoptera allergy: no Urticaria:  angioedema  Eczema:no History of recurrent infections suggestive of immunodeficency: no Vaccinations are up to date.   Past  Medical History: Past Medical History:  Diagnosis Date   H/O dysmenorrhea 2006   H/O: menorrhagia 2006   Headache(784.0)    History of herpes simplex type 2 infection    Hypertension    Monilia infection 2002   Stress 2006   Varicose veins 2006   Medication List:  Current Outpatient Medications  Medication Sig Dispense Refill   azelastine (ASTELIN) 0.1 % nasal spray Place 2 sprays into both nostrils 2 (two) times daily. Use in each nostril as directed 30 mL 12   fluticasone (FLONASE) 50 MCG/ACT nasal spray Place 1 spray into both nostrils daily. 16 g 2   amLODipine (NORVASC) 5 MG tablet Take 5 mg by mouth daily.     famotidine (PEPCID) 20 MG tablet Take 1 tablet (20 mg total) by mouth 2 (two) times daily for 7 days. 14 tablet 0   hydrochlorothiazide (HYDRODIURIL) 25 MG tablet Take 25 mg by mouth daily.     valACYclovir (VALTREX) 1000 MG tablet Take 1,000 mg by mouth 2 (two) times daily.     No current facility-administered medications for this visit.   Known Allergies:  No Known Allergies Past Surgical History: No past surgical history on file. Family History: Family History  Problem Relation Age of Onset   Hypertension Mother    Cancer Father    Cancer Maternal Aunt        breast   Hypertension Maternal Aunt    Diabetes Maternal Aunt    Breast cancer Maternal Aunt    Breast cancer Cousin      ROS:  All other systems negative except as noted per HPI.  Objective:  Blood pressure 122/84, pulse  76, temperature 97.7 F (36.5 C), temperature source Temporal, resp. rate 17, height 5' 5.8" (1.671 m), weight 222 lb (100.7 kg), last menstrual period 03/19/2012, SpO2 98%. Body mass index is 36.05 kg/m. Physical Exam:  General Appearance:  Alert, cooperative, no distress, appears stated age  Head:  Normocephalic, without obvious abnormality, atraumatic  Eyes:  Conjunctiva clear, EOM's intact  Ears EACs normal bilaterally  Nose: Nares normal, normal mucosa, no visible  anterior polyps, and septum midline  Throat: Lips, tongue normal; teeth and gums normal, + cobblestoning  Neck: Supple, symmetrical  Lungs:   clear to auscultation bilaterally, Respirations unlabored, no coughing  Heart:  regular rate and rhythm and no murmur, Appears well perfused  Extremities: No edema  Skin: Skin color, texture, turgor normal and no rashes or lesions on visualized portions of skin  Neurologic: No gross deficits   Diagnostics: None    Labs:  Lab Orders         Complement component c1q         C4 complement         C1 esterase inhibitor, functional         C1 Esterase Inhibitor         Tryptase       Assessment and Plan  Assessment and Plan    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 Bradykinin and histamine pathways considered. - Order labs for bradykinin and histamine pathways.  -c1q, c1 esterase level and function, C4, tryptase  - Schedule allergy testing for food and spice allergies. - Advise avoidance of spicy foods, shrimp, and jalapeno chips.  Postnasal drip Postnasal drip likely due to sinus drainage causing throat sticking sensation and congestion. - Start Flonase, one spray per nostril at night. - Start Astelin, one spray per nostril twice daily after allergy testing. - Schedule allergy testing for environmental allergens. - Will start daily antihistamine after allergy testing   Vocal cord dysfunction (VCD) Suspected VCD with throat closing sensation and hoarseness, possibly triggered by strong odors. - Provide vocal exercises for VCD management. - Consider referral to speech therapy if symptoms persist.  Reflux-induced respiratory symptoms Symptoms suggestive of reflux-induced respiratory disease, exacerbated by lying flat. - Elevate head of bed. - Restart Pepcid after allergy testing.  Epistaxis Epistaxis likely due to dry air, especially in winter. -Nasal saline spray and/or nasal saline  gel is recommended to moisturize nasal mucosa. -The use of a cool-mist humidifier during the night is recommended. -During epistaxis, if needed, oxymetazoline (Afrin) nasal spray may be applied to a cotton ball to help stanch the blood flow. -If this problem persists or progresses, otolaryngology evaluation may be warranted.    Follow up: for allergy testing (1-68, other select foods)       This note in its entirety was forwarded to the Provider who requested this consultation.  Other:  none   Thank you for your kind referral. I appreciate the opportunity to take part in Early's care. Please do not hesitate to contact me with questions.  Sincerely,  Thank you so much for letting me partake in your care today.  Don't hesitate to reach out if you have any additional concerns!  Ferol Luz, MD  [facility] [place], [place]

## 2023-11-10 NOTE — Patient Instructions (Addendum)
 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 Bradykinin and histamine pathways considered. - Order labs for bradykinin and histamine pathways.  -c1q, c1 esterase level and function, C4, tryptase  - Schedule allergy testing for food and spice allergies. - Advise avoidance of spicy foods, shrimp, and jalapeno chips.  Postnasal drip Postnasal drip likely due to sinus drainage causing throat sticking sensation and congestion. - Start Flonase, one spray per nostril at night. - Start Astelin, one spray per nostril twice daily after allergy testing. - Schedule allergy testing for environmental allergens. - Will start daily antihistamine after allergy testing   Vocal cord dysfunction (VCD) Suspected VCD with throat closing sensation and hoarseness, possibly triggered by strong odors. - Provide vocal exercises for VCD management. - Consider referral to speech therapy if symptoms persist.  Reflux-induced respiratory symptoms Symptoms suggestive of reflux-induced respiratory disease, exacerbated by lying flat. - Elevate head of bed. - Restart Pepcid after allergy testing.  Epistaxis Epistaxis likely due to dry air, especially in winter. -Nasal saline spray and/or nasal saline gel is recommended to moisturize nasal mucosa. -The use of a cool-mist humidifier during the night is recommended. -During epistaxis, if needed, oxymetazoline (Afrin) nasal spray may be applied to a cotton ball to help stanch the blood flow. -If this problem persists or progresses, otolaryngology evaluation may be warranted.      Follow up: for allergy testing (1-68, other select foods)   Thank you so much for letting me partake in your care today.  Don't hesitate to reach out if you have any additional concerns!  Grace Luz, MD  [facility] [place], [place]  Vocal Cord Dysfunction (VCD) /Inspiratory Laryngeal Obstruction (ILO)/ Exercise- Induced  Laryngeal Obstruction Exercises (EILO)  Practice these techniques several times a day, so that when you need them, they will be automatic, and will work right away (or very fast) to open up the spasming (closed) vocal cords.  PREPARATION: ? Loosen clothing at waist, so nothing is tight or snug. Open top buttons of pants or skirt, unzip pant or skirt zippers, pull shirt out over pants or skirt. This prevents pressure on the stomach, which can cause stomach reflux (backup of corrosive liquid) into the esophagus. Gastric reflux is a frequent cause of VCD attacks. ? Sit in a comfortable chair. When you need to use these techniques, you may be standing, lying, or sitting-BUT first try to learn them while sitting because they are easier to learn in this position. ? Keep water handy. Take sips, so your mouth and throat will not dry out. Swallow carefully, to avoid choking. ? Put your right (or left) thumb on your belly button, with the rest of your fingers below the thumb, so you are GENTLY touching your belly (lower abdomen). ? Doing this will focus your attention on your lower abdominal muscles. ? Relax your chest. Relax your shoulders. Relax your neck. Relax your throat. This helps you to try to use ONLY your belly (lower abdomen) muscles. Consciously try NOT to use chest muscles, etc. Chest muscle use seems to irritate vocal cords while "belly" muscles tend to relax them.  BREATHE OUT (EXHALE) FIRST WITH slightly PURSED LIPS: ? Since most people cannot inhale, (breathe in), during VCD attacks, please start by breathing out (exhaling) in the special way described below, and this will usually open up the vocal cords, immediately. ? Start, by breathing out (exhaling) with lips "pursed" as if you are trying to gently blow out  a candle. ? This is similar to whistling, but your lips will not be as "puckered' as when whistling and your lips will not be pushed forward, like when whistling. If you look  in a mirror, you would see a mostly horizontal line of space between the lips, while you exhale the 'ffffffffffffffffffff'. ? This may be silent, or may sound like a gentle wind or breeze, like 'fffffffffffffff' and your lips should be symmetrical, around your teeth. You lower lip will not be touching your upper teeth, like when someone says the name [id number]. This is one continuous flow of exhaled air, either silent, or making a slightly windy sound. ? Feel your hand on your belly (lower abdomen) come IN, a little bit toward your back, as you are 'working' only your lower belly muscles. ? This abdominal exhaled 'fffffffffffffffff' alone often stops VCD attacks very quickly.  ? Some prefer to try a variation of exhaling 'ffffffffffff'. Try exhaling quickly, 'ffff', 'fffff', 'ffff'--all part of one exhalation. Do not inhale in between quick "fff's. This helps some people more quickly open up the spasming vocal cords. This is like blowing out a slightly stubborn candle, exhaling against a tiny bit of resistance (like trying to blow out a trick birthday candle). ? If any of this helps, try to slow down the exhalations, and gently exhale 'ffffffffffffffffff'. ? Some people prefer to exhale gently 'ssssssssssssssss' or 'shhhhhhhhhhhhh' rather than 'fffffffffffffff', etc. Choose what works best in your case, or what is most comfortable for you. ? You may need to repeat exhaling 'ffffffffffffff' (or 'ffff', 'ffff', 'ffff'), etc, several times to relax the spasming vocal cords. Repeating this often stops a VCD attack so that you can inhale (breathe in) again.

## 2023-11-15 ENCOUNTER — Ambulatory Visit: Admitting: Internal Medicine

## 2023-11-15 DIAGNOSIS — T783XXD Angioneurotic edema, subsequent encounter: Secondary | ICD-10-CM | POA: Diagnosis not present

## 2023-11-15 DIAGNOSIS — J3089 Other allergic rhinitis: Secondary | ICD-10-CM | POA: Diagnosis not present

## 2023-11-15 DIAGNOSIS — K219 Gastro-esophageal reflux disease without esophagitis: Secondary | ICD-10-CM

## 2023-11-15 DIAGNOSIS — T783XXA Angioneurotic edema, initial encounter: Secondary | ICD-10-CM

## 2023-11-15 DIAGNOSIS — J387 Other diseases of larynx: Secondary | ICD-10-CM

## 2023-11-15 MED ORDER — FAMOTIDINE 20 MG PO TABS: 20.0000 mg | ORAL_TABLET | Freq: Two times a day (BID) | ORAL | 5 refills | Status: AC

## 2023-11-15 MED ORDER — EPINEPHRINE 0.3 MG/0.3ML IJ SOAJ: 0.3000 mg | INTRAMUSCULAR | 1 refills | Status: AC | PRN

## 2023-11-15 MED ORDER — LEVOCETIRIZINE DIHYDROCHLORIDE 5 MG PO TABS: 5.0000 mg | ORAL_TABLET | Freq: Every evening | ORAL | 5 refills | Status: AC

## 2023-11-15 NOTE — Patient Instructions (Signed)
 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 Bradykinin and histamine pathways considered. - Allergy test; positive to oyster and scallops  - Strict avoidance of shellfish ( shirmp ,crab, lobster, crawfish) and scallops, oysters, clams   Postnasal drip Postnasal drip likely due to sinus drainage causing throat sticking sensation and congestion. Allergy test: positive to grass, weed, tree, dust mite  - Start avoidance measures  - Continue Flonase, one spray per nostril at night. - Continue Astelin, one spray per nostril twice daily after allergy testing. - Start daily antihistamine: options include cetirizine, fexofenadine, loratadine, levocetirizine   Vocal cord dysfunction (VCD) Suspected VCD with throat closing sensation and hoarseness, possibly triggered by strong odors. - Continue vocal exercises for VCD management. - Consider referral to speech therapy if symptoms persist.  Reflux-induced respiratory symptoms Symptoms suggestive of reflux-induced respiratory disease, exacerbated by lying flat. - Elevate head of bed. - Start Pepcid 20mg  daily   Epistaxis Epistaxis likely due to dry air, especially in winter. -Nasal saline spray and/or nasal saline gel is recommended to moisturize nasal mucosa. -The use of a cool-mist humidifier during the night is recommended. -During epistaxis, if needed, oxymetazoline (Afrin) nasal spray may be applied to a cotton ball to help stanch the blood flow. -If this problem persists or progresses, otolaryngology evaluation may be warranted.    Follow up: 3 months   Thank you so much for letting me partake in your care today.  Don't hesitate to reach out if you have any additional concerns!  Ferol Luz, MD  [facility] [place], [place]  Reducing Pollen Exposure  The American Academy of Allergy, Asthma and Immunology suggests the following steps to reduce your exposure to pollen during  allergy seasons.    Do not hang sheets or clothing out to dry; pollen may collect on these items. Do not mow lawns or spend time around freshly cut grass; mowing stirs up pollen. Keep windows closed at night.  Keep car windows closed while driving. Minimize morning activities outdoors, a time when pollen counts are usually at their highest. Stay indoors as much as possible when pollen counts or humidity is high and on windy days when pollen tends to remain in the air longer. Use air conditioning when possible.  Many air conditioners have filters that trap the pollen spores. Use a HEPA room air filter to remove pollen form the indoor air you breathe.  DUST MITE AVOIDANCE MEASURES:  There are three main measures that need and can be taken to avoid house dust mites:  Reduce accumulation of dust in general -reduce furniture, clothing, carpeting, books, stuffed animals, especially in bedroom  Separate yourself from the dust -use pillow and mattress encasements (can be found at stores such as [employer] or online) -avoid direct exposure to air condition flow -use a HEPA filter device, especially in the bedroom; you can also use a HEPA filter vacuum cleaner -wipe dust with a moist towel instead of a dry towel or broom when cleaning  Decrease mites and/or their secretions -wash clothing and linen and stuffed animals at highest temperature possible, at least every 2 weeks -stuffed animals can also be placed in a bag and put in a freezer overnight  Despite the above measures, it is impossible to eliminate dust mites or their allergen completely from your home.  With the above measures the burden of mites in your home can be diminished, with the goal of minimizing your allergic symptoms.  Success will be reached only when implementing and using all means together.

## 2023-11-15 NOTE — Progress Notes (Signed)
 Date of Service/Encounter:  11/15/23  Allergy testing appointment   Initial visit on 11/10/23, seen for angioedema .  Please see that note for additional details.  Today reports for allergy diagnostic testing:    DIAGNOSTICS:  Skin Testing: Environmental allergy panel and select foods. Adequate positive and negative controls Results discussed with patient/family.  Airborne Adult Perc - 11/15/23 1130     Time Antigen Placed 1130    Allergen Manufacturer Waynette Buttery    Location Back    Number of Test 55    1. Control-Buffer 50% Glycerol Negative    2. Control-Histamine 4+    3. Bahia Negative    4. [place] 3+    5. Johnson 2+    6. Kentucky Blue Negative    7. Meadow Fescue Negative    8. Perennial Rye 3+    9. Timothy 2+    10. Ragweed Mix Negative    11. Cocklebur Negative    12. Plantain,  English 2+    13. Baccharis Negative    14. Dog Fennel Negative    15. Russian Thistle 3+    16. Lamb's Quarters 3+    17. Sheep Sorrell 3+    18. Rough Pigweed 3+    19. Marsh Elder, Rough Negative    20. Mugwort, Common Negative    21. Box, Elder 2+    22. Cedar, red 4+    23. Sweet Gum 2+    24. Pecan Pollen 2+    25. Pine Mix Negative    26. Walnut, Black Pollen 2+    27. Red Mulberry 3+    28. Ash Mix 3+    29. Birch Mix 3+    30. Beech American 3+    31. Cottonwood, Eastern 3+    32. Hickory, White 3+    33. Maple Mix Negative    34. Oak, [place] Mix 3+    35. Sycamore Eastern 3+    36. Alternaria Alternata Negative    37. Cladosporium Herbarum Negative    38. Aspergillus Mix Negative    39. Penicillium Mix Negative    40. Bipolaris Sorokiniana (Helminthosporium) Negative    41. Drechslera Spicifera (Curvularia) Negative    42. Mucor Plumbeus Negative    43. Fusarium Moniliforme Negative    44. Aureobasidium Pullulans (pullulara) Negative    45. Rhizopus Oryzae Negative    46. Botrytis Cinera Negative    47. Epicoccum Nigrum Negative    48. Phoma Betae Negative     49. Dust Mite Mix Negative    50. Cat Hair 10,000 BAU/ml Negative    51.  Dog Epithelia Negative    52. Mixed Feathers Negative    53. Horse Epithelia Negative    54. Cockroach, German Negative    55. Tobacco Leaf Negative             Intradermal - 11/15/23 1151     Time Antigen Placed 1152    Location Arm    Number of Test 11    Control Negative    Bahia 2+    Ragweed Mix 3+    Mold 1 Negative    Mold 2 Negative    Mold 3 Negative    Mold 4 Negative    Mite Mix Negative    Cat Negative    Dog Negative    Cockroach Negative             Food Adult Perc - 11/15/23 1100     Time  Antigen Placed 1124    Allergen Manufacturer Greer    Location Back    Number of allergen test 23    1. Peanut Negative    8. Shellfish Mix Negative    9. Fish Mix Negative    10. Cashew Negative    11. Walnut Food Negative    12. Almond Negative    13. Hazelnut Negative    14. Pecan Food Negative    15. Pistachio Negative    16. [place] Nut Negative    17. Coconut Negative    23. Shrimp Negative    24. Crab Negative    25. Lobster Negative    26. Oyster 2+    27. Scallops 2+    38. Tomato Negative    39. White Potato Negative    40. Sweet Potato Negative    45. Green Pepper Negative    47. Onion Negative    69. Ginger Negative    70. Garlic Negative    71. Pepper, Black Negative             Allergy testing results were read and interpreted by myself, documented by clinical staff.  Patient provided with copy of allergy testing along with avoidance measures when indicated.   Ferol Luz, MD  [facility]

## 2023-11-15 NOTE — Addendum Note (Signed)
 Addended by: Modesto Charon on: 11/15/2023 01:49 PM   Modules accepted: Orders

## 2023-11-18 ENCOUNTER — Other Ambulatory Visit: Payer: Self-pay

## 2023-11-18 ENCOUNTER — Telehealth: Payer: Self-pay

## 2023-11-18 NOTE — Telephone Encounter (Signed)
Pt called requesting lab result

## 2023-11-19 LAB — C1 ESTERASE INHIBITOR, FUNCTIONAL: C1INH Functional/C1INH Total MFr SerPl: >110 %{normal}

## 2023-11-19 LAB — C1 ESTERASE INHIBITOR: C1INH SerPl-mCnc: 40 mg/dL — ABNORMAL HIGH (ref 21–39)

## 2023-11-19 LAB — TRYPTASE: Tryptase: 10.4 ug/L (ref 2.2–13.2)

## 2023-11-19 LAB — COMPLEMENT COMPONENT C1Q: Complement C1Q: 15.2 mg/dL (ref 10.3–20.5)

## 2023-11-19 LAB — C4 COMPLEMENT: Complement C4, Serum: 55 mg/dL — ABNORMAL HIGH (ref 12–38)

## 2023-11-21 NOTE — Telephone Encounter (Signed)
 Swelling labs were all normal except for her tryptase (marker of mast cell activation) which was slightly elevated at 10.4.  This tells [place] the swelling is coming from histamine and the treatment plan is high dose antihistamines to calm down the skin.  The plan discussed at the testing appointment remains the same and I would like to see her back in 3-6 months

## 2023-12-22 ENCOUNTER — Other Ambulatory Visit: Payer: Self-pay | Admitting: Family Medicine

## 2023-12-22 DIAGNOSIS — Z1231 Encounter for screening mammogram for malignant neoplasm of breast: Secondary | ICD-10-CM

## 2024-01-24 ENCOUNTER — Ambulatory Visit
Admission: RE | Admit: 2024-01-24 | Discharge: 2024-01-24 | Disposition: A | Discharge status: Home / Self Care | Source: Ambulatory Visit | Attending: Family Medicine | Admitting: Family Medicine

## 2024-01-24 DIAGNOSIS — Z1231 Encounter for screening mammogram for malignant neoplasm of breast: Secondary | ICD-10-CM
